# Patient Record
Sex: Male | Born: 2000 | Race: Black or African American | Hispanic: No | Marital: Single | Smoking: Never smoker
Health system: Southern US, Community
[De-identification: ages and names within clinical notes are randomized; demographics above are authoritative.]

---
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
## 2020-03-06 ENCOUNTER — Emergency Department: Payer: Medicaid Other

## 2020-03-06 ENCOUNTER — Other Ambulatory Visit: Payer: Self-pay

## 2020-03-06 ENCOUNTER — Emergency Department
Admission: EM | Admit: 2020-03-06 | Discharge: 2020-03-07 | Disposition: A | Discharge status: Home / Self Care | Payer: Medicaid Other | Attending: Student in an Organized Health Care Education/Training Program | Admitting: Student in an Organized Health Care Education/Training Program

## 2020-03-06 ENCOUNTER — Encounter: Payer: Self-pay | Admitting: Emergency Medicine

## 2020-03-06 DIAGNOSIS — T1490XA Injury, unspecified, initial encounter: Secondary | ICD-10-CM

## 2020-03-06 DIAGNOSIS — S24109A Unspecified injury at unspecified level of thoracic spinal cord, initial encounter: Secondary | ICD-10-CM | POA: Diagnosis present

## 2020-03-06 DIAGNOSIS — Y92410 Unspecified street and highway as the place of occurrence of the external cause: Secondary | ICD-10-CM | POA: Diagnosis not present

## 2020-03-06 DIAGNOSIS — S21211A Laceration without foreign body of right back wall of thorax without penetration into thoracic cavity, initial encounter: Secondary | ICD-10-CM | POA: Diagnosis not present

## 2020-03-06 DIAGNOSIS — Z23 Encounter for immunization: Secondary | ICD-10-CM | POA: Diagnosis not present

## 2020-03-06 DIAGNOSIS — R0602 Shortness of breath: Secondary | ICD-10-CM | POA: Diagnosis not present

## 2020-03-06 DIAGNOSIS — Z87828 Personal history of other (healed) physical injury and trauma: Secondary | ICD-10-CM | POA: Diagnosis not present

## 2020-03-06 DIAGNOSIS — W25XXXA Contact with sharp glass, initial encounter: Secondary | ICD-10-CM | POA: Insufficient documentation

## 2020-03-06 DIAGNOSIS — S21221A Laceration with foreign body of right back wall of thorax without penetration into thoracic cavity, initial encounter: Secondary | ICD-10-CM | POA: Diagnosis not present

## 2020-03-06 DIAGNOSIS — S299XXA Unspecified injury of thorax, initial encounter: Secondary | ICD-10-CM | POA: Diagnosis not present

## 2020-03-06 DIAGNOSIS — R918 Other nonspecific abnormal finding of lung field: Secondary | ICD-10-CM | POA: Diagnosis not present

## 2020-03-06 LAB — BASIC METABOLIC PANEL
Anion gap: 12 (ref 5–15)
BUN: 10 mg/dL (ref 6–20)
CO2: 23 mmol/L (ref 22–32)
Calcium: 9 mg/dL (ref 8.9–10.3)
Chloride: 107 mmol/L (ref 98–111)
Creatinine, Ser: 1.1 mg/dL (ref 0.61–1.24)
GFR, Estimated: >60 mL/min (ref >60)
Glucose, Bld: 109 mg/dL — ABNORMAL HIGH (ref 70–99)
Potassium: 4 mmol/L (ref 3.5–5.1)
Sodium: 142 mmol/L (ref 135–145)

## 2020-03-06 LAB — CBC
HCT: 45 % (ref 39.0–52.0)
Hemoglobin: 15.1 g/dL (ref 13.0–17.0)
MCH: 27.4 pg (ref 26.0–34.0)
MCHC: 33.6 g/dL (ref 30.0–36.0)
MCV: 81.5 fL (ref 80.0–100.0)
Platelets: 237 10*3/uL (ref 150–400)
RBC: 5.52 MIL/uL (ref 4.22–5.81)
RDW: 12.5 % (ref 11.5–15.5)
WBC: 7.1 10*3/uL (ref 4.0–10.5)
nRBC: 0 % (ref 0.0–0.2)

## 2020-03-06 MED ORDER — ONDANSETRON HCL 4 MG/2ML IJ SOLN
4.0000 mg | Freq: Once | INTRAMUSCULAR | Status: AC
  Administered 2020-03-06: 22:00:00 4 mg via INTRAVENOUS
  Filled 2020-03-06: qty 2

## 2020-03-06 MED ORDER — IOHEXOL 300 MG/ML  SOLN
75.0000 mL | Freq: Once | INTRAMUSCULAR | Status: AC | PRN
  Administered 2020-03-06: 22:00:00 75 mL via INTRAVENOUS

## 2020-03-06 MED ORDER — BACITRACIN-NEOMYCIN-POLYMYXIN 400-5-5000 EX OINT
TOPICAL_OINTMENT | CUTANEOUS | Status: AC
  Administered 2020-03-06: 1
  Filled 2020-03-06: qty 1

## 2020-03-06 MED ORDER — TETANUS-DIPHTH-ACELL PERTUSSIS 5-2.5-18.5 LF-MCG/0.5 IM SUSY
0.5000 mL | PREFILLED_SYRINGE | Freq: Once | INTRAMUSCULAR | Status: AC
  Administered 2020-03-06: 22:00:00 0.5 mL via INTRAMUSCULAR
  Filled 2020-03-06: qty 0.5

## 2020-03-06 MED ORDER — BACITRACIN ZINC 500 UNIT/GM EX OINT: TOPICAL_OINTMENT | Freq: Once | CUTANEOUS | Status: AC

## 2020-03-06 MED ORDER — MORPHINE SULFATE (PF) 4 MG/ML IV SOLN
4.0000 mg | INTRAVENOUS | Status: DC | PRN
Start: 2020-03-06 — End: 2020-03-07
  Administered 2020-03-06: 4 mg via INTRAVENOUS
  Filled 2020-03-06: qty 1

## 2020-03-06 MED ORDER — SODIUM CHLORIDE 0.9 % IV BOLUS
500.0000 mL | Freq: Once | INTRAVENOUS | Status: AC
  Administered 2020-03-06: 22:00:00 500 mL via INTRAVENOUS

## 2020-03-06 MED ORDER — LIDOCAINE-EPINEPHRINE 2 %-1:100000 IJ SOLN
20.0000 mL | Freq: Once | INTRAMUSCULAR | Status: AC
  Administered 2020-03-06: 22:00:00 20 mL via INTRADERMAL
  Filled 2020-03-06: qty 1
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
## 2020-03-23 ENCOUNTER — Encounter: Payer: Self-pay | Admitting: Emergency Medicine

## 2020-03-23 ENCOUNTER — Emergency Department
Admission: EM | Admit: 2020-03-23 | Discharge: 2020-03-23 | Disposition: A | Discharge status: Home / Self Care | Payer: Medicaid Other | Attending: Student in an Organized Health Care Education/Training Program | Admitting: Student in an Organized Health Care Education/Training Program

## 2020-03-23 ENCOUNTER — Other Ambulatory Visit: Payer: Self-pay

## 2020-03-23 DIAGNOSIS — Z4802 Encounter for removal of sutures: Secondary | ICD-10-CM | POA: Insufficient documentation

## 2020-03-23 DIAGNOSIS — S21211D Laceration without foreign body of right back wall of thorax without penetration into thoracic cavity, subsequent encounter: Secondary | ICD-10-CM | POA: Diagnosis not present

## 2020-03-23 NOTE — ED Triage Notes (Signed)
Patient was seen here on 03/06/20 and had stitches placed to back. Patient is here today to have stitches removed.

## 2020-03-23 NOTE — ED Provider Notes (Signed)
[facility] Emergency Department Provider Note  ____________________________________________  Time seen: Approximately 10:39 AM  I have reviewed the triage vital signs and the nursing notes.   HISTORY  Chief Complaint Suture / Staple Removal    HPI Marc Rogers is a 20 y.o. male that presents to the emergency department for suture removal.  Sutures were placed about 2 weeks ago.  No complications.   History reviewed. No pertinent past medical history.  There are no problems to display for this patient.   History reviewed. No pertinent surgical history.  Prior to Admission medications   Not on File    Allergies Patient has no known allergies.  No family history on file.  Social History Social History   Tobacco Use  . Smoking status: Never Smoker  . Smokeless tobacco: Never Used  Substance Use Topics  . Alcohol use: Never     Review of Systems  Cardiovascular: No chest pain. Respiratory: No SOB. Gastrointestinal: No abdominal pain.  No nausea, no vomiting.  Musculoskeletal: Negative for musculoskeletal pain. Skin: Negative for rash, abrasions, lacerations, ecchymosis. Neurological: Negative for headaches   ____________________________________________   PHYSICAL EXAM:  VITAL SIGNS: ED Triage Vitals [03/23/20 0955]  Enc Vitals Group     BP (!) 142/69     Pulse Rate 69     Resp 18     Temp 98.6 F (37 C)     Temp Source Oral     SpO2 97 %     Weight 199 lb 15.3 oz (90.7 kg)     Height 5\' 7"  (1.702 m)     Head Circumference      Peak Flow      Pain Score 0     Pain Loc      Pain Edu?      Excl. in GC?      Constitutional: Alert and oriented. Well appearing and in no acute distress. Eyes: Conjunctivae are normal. PERRL. EOMI. Head: Atraumatic. ENT:      Ears:      Nose: No congestion/rhinnorhea.      Mouth/Throat: Mucous membranes are moist.  Neck: No stridor.   Cardiovascular: Normal rate, regular rhythm.   Good peripheral circulation. Respiratory: Normal respiratory effort without tachypnea or retractions. Lungs CTAB. Good air entry to the bases with no decreased or absent breath sounds. Musculoskeletal: Full range of motion to all extremities. No gross deformities appreciated. Neurologic:  Normal speech and language. No gross focal neurologic deficits are appreciated.  Skin:  Skin is warm, dry and intact.  6 sutures in place to back.  No surrounding erythema or drainage. Psychiatric: Mood and affect are normal. Speech and behavior are normal. Patient exhibits appropriate insight and judgement.   ____________________________________________   LABS (all labs ordered are listed, but only abnormal results are displayed)  Labs Reviewed - No data to display ____________________________________________  EKG   ____________________________________________  RADIOLOGY  No results found.  ____________________________________________    PROCEDURES  Procedure(s) performed:    Procedures  SUTURE REMOVAL Performed by:  Consent: Verbal consent obtained. Patient identity confirmed: provided demographic data Time out: Immediately prior to procedure a "time out" was called to verify the correct patient, procedure, equipment, support staff and site/side marked as required.  Location details: back  Wound Appearance: clean  Sutures/Staples Removed: 6  Facility: sutures placed in this facility Patient tolerance: Patient tolerated the procedure well with no immediate complications.    Medications - No data to display  ____________________________________________   INITIAL IMPRESSION / ASSESSMENT AND PLAN / ED COURSE  Pertinent labs & imaging results that were available during my care of the patient were reviewed by me and considered in my medical decision making (see chart for details).  Review of the [place] CSRS was performed in accordance of the [facility] prior to  dispensing any controlled drugs.   Patient presented to the emergency department for suture removal.  Sutures were removed in the emergency department without complication. Patient is to follow up with PCP as directed. Patient is given ED precautions to return to the ED for any worsening or new symptoms.  Marc Rogers was evaluated in Emergency Department on 03/23/2020 for the symptoms described in the history of present illness. He was evaluated in the context of the global COVID-19 pandemic, which necessitated consideration that the patient might be at risk for infection with the SARS-CoV-2 virus that causes COVID-19. Institutional protocols and algorithms that pertain to the evaluation of patients at risk for COVID-19 are in a state of rapid change based on information released by regulatory bodies including the CDC and federal and state organizations. These policies and algorithms were followed during the patient's care in the ED.   ____________________________________________  FINAL CLINICAL IMPRESSION(S) / ED DIAGNOSES  Final diagnoses:  Visit for suture removal      NEW MEDICATIONS STARTED DURING THIS VISIT:  ED Discharge Orders    None          This chart was dictated using voice recognition software/Dragon. Despite best efforts to proofread, errors can occur which can change the meaning. Any change was purely unintentional.    Enid Derry, PA-C 03/23/20 1251    Willy Eddy, MD 03/23/20 1341
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
## 2021-07-08 ENCOUNTER — Other Ambulatory Visit: Payer: Self-pay

## 2021-07-08 ENCOUNTER — Encounter: Payer: Self-pay | Admitting: Emergency Medicine

## 2021-07-08 ENCOUNTER — Emergency Department
Admission: EM | Admit: 2021-07-08 | Discharge: 2021-07-08 | Disposition: A | Discharge status: Home / Self Care | Payer: Medicaid Other | Attending: Emergency Medicine | Admitting: Emergency Medicine

## 2021-07-08 ENCOUNTER — Emergency Department: Payer: Medicaid Other

## 2021-07-08 DIAGNOSIS — S161XXA Strain of muscle, fascia and tendon at neck level, initial encounter: Secondary | ICD-10-CM | POA: Diagnosis not present

## 2021-07-08 DIAGNOSIS — S60512A Abrasion of left hand, initial encounter: Secondary | ICD-10-CM | POA: Diagnosis not present

## 2021-07-08 DIAGNOSIS — M542 Cervicalgia: Secondary | ICD-10-CM

## 2021-07-08 DIAGNOSIS — M79645 Pain in left finger(s): Secondary | ICD-10-CM | POA: Diagnosis not present

## 2021-07-08 DIAGNOSIS — M79642 Pain in left hand: Secondary | ICD-10-CM | POA: Diagnosis not present

## 2021-07-08 DIAGNOSIS — S199XXA Unspecified injury of neck, initial encounter: Secondary | ICD-10-CM | POA: Diagnosis present

## 2021-07-08 MED ORDER — CYCLOBENZAPRINE HCL 10 MG PO TABS: 10.0000 mg | ORAL_TABLET | Freq: Three times a day (TID) | ORAL | 0 refills | Status: AC | PRN

## 2021-07-08 NOTE — Discharge Instructions (Addendum)
-  Please take the cyclobenzaprine as prescribed.  You may additionally take Tylenol/ibuprofen as needed for pain. ?-Follow-up with the orthopedist listed above.  Maintain the splint until you can be seen by orthopedics. ?-Return to the emergency department at any time if you begin to experience any new or worsening symptoms ?

## 2021-07-08 NOTE — ED Notes (Signed)
Dc instructions and scripts reviewed with pt no questions or concerns at this time. Will follow up with ortho  ?
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
## 2022-08-22 ENCOUNTER — Other Ambulatory Visit: Payer: Self-pay | Admitting: Certified Nurse Midwife

## 2022-08-22 MED ORDER — AZITHROMYCIN 500 MG PO TABS: 1000.0000 mg | ORAL_TABLET | Freq: Once | ORAL | 0 refills | Status: AC

## 2022-08-31 DIAGNOSIS — Z113 Encounter for screening for infections with a predominantly sexual mode of transmission: Secondary | ICD-10-CM | POA: Diagnosis not present
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
## 2024-03-29 ENCOUNTER — Ambulatory Visit: Payer: Self-pay

## 2024-03-29 DIAGNOSIS — Z113 Encounter for screening for infections with a predominantly sexual mode of transmission: Secondary | ICD-10-CM

## 2024-03-29 NOTE — Progress Notes (Signed)
 " [facility] Department STI clinic [address] [place] [address] Main phone: [phone]  STI screening visit  Subjective:  Benecio Kluger is a 24 y.o. male being seen today for an STI screening visit. The patient reports they do not have symptoms.    Patient has the following medical conditions:  There are no active problems to display for this patient.  Chief Complaint  Patient presents with   SEXUALLY TRANSMITTED DISEASE   HPI Patient reports desire for STI testing - GC/CT only. Declines blood tests today. No symptoms.   Reproductive considerations: Does the patient or their partner desires a pregnancy in the next year? No  See flowsheet for further details and programmatic requirements  Hyperlink available at the top of the signed note in blue.  Flow sheet content below:  Pregnancy Intention Screening Does the patient want to become pregnant in the next year?: N/A Does the patient's partner want to become pregnant in the next year?: Yes Would the patient like to discuss contraceptive options today?: N/A Reason For STD Screen STD Screening: Is asymptomatic Have you ever had an STD?: Yes History of Antibiotic use in the past 2 weeks?: No STD Symptoms Denies all: Yes Counseling Patient counseled to use condoms with all sex: Condoms declined RTC in 2-3 weeks for test results: Yes Clinic will call if test results abnormal before test result appt.: Yes Immunizations: Referred, Immunization history assessed Test results given to patient Patient counseled to use condoms with all sex: Condoms declined  Screening for MPX risk:  Unexplained rash?  No   MSM?  No   Multiple or anonymous sex partners?  No   Any close or sexual contact with a person  diagnosed with MPX?  No   Any outside the [place]  where MPX is endemic?  No   High clinical suspicion for MPX?    -Unlikely to be chickenpox    -Lymphadenopathy    -Rash that presents  in same phase of       evolution on any given body part  No   Does this patient meet CDC recommendations for vaccination against MPOX? No  You already have or anticipate having the following risks:  Your sex partner has the following risks: You're traveling to a county with a clade I MPOX outbreak and anticipate these risks: Occupational exposure  You had known or suspected exposure to someone with monkeypox You had a sex partner in the past 2 weeks who was diagnosed with monkeypox You are a gay, bisexual, or other man who has sex with men, or are transgender or nonbinary and in the past 6 months have had any of the following: - A new diagnosis of one or more sexually transmitted diseases (e.g., chlamydia, gonorrhea, or syphilis) - More than one sex partner You have had any of the following in the past 6 months: - Sex at a commercial sex venue (like a sex club or bathhouse) - Sex related to a large commercial event   or in a geographic area (city or county for example) where mpox virus transmission is occurring Sex with a new partner Sex at a commercial sex venue (e.g., a sex club or bathhouse) Sex in it consultant for money, goods, drugs, or other trade Sex in association with a large public event (e.g., a rave, party, or festival) i.e. certain people who work in a laboratory or healthcare facility   Infectious disease screenings: Vaccinated against HPV? Yes  HIV Ever  had a positive? No Last test: unknown Results in chart:  No results found for: HMHIVSCREEN No results found for: HIV   Hep B Hep B status: unknown or no prior testing Received HBV vaccination? Yes Received HBV testing for immunity? Unknown Results in chart:  No components found for: HMHEPBSCREEN  Do they qualify for HBV screening today? Declines blood tests  Criteria:  -Household, sexual or needle sharing contact with HBV -History of drug use or homelessness -HIV positive -Those with known Hep C  Hep C Hep C  status: unknown or no prior testing Results in chart:  No results found for: HMHEPCSCREEN No components found for: HEPC  Do they qualify for HCV screening today? Declines blood tests  Criteria - since the last HCV result, does the patient have any of the following? - Current drug use - Have a partner with drug use - Has been incarcerated  Immunization history:  Immunization History  Administered Date(s) Administered   Tdap 03/06/2020    The following portions of the patient's history were reviewed and updated as appropriate: allergies, current medications, past medical history, past social history, past surgical history and problem list.  Substance use screenings:  Uses tobacco products? No Uses vapes? No Uses alcohol? No Uses non-injectable substances that alter your mental status? Yes Uses non-prescribed injectable substances? No  Immunization History  Administered Date(s) Administered   Tdap 03/06/2020    The following portions of the patient's history were reviewed and updated as appropriate: allergies, current medications, past medical history, past social history, past surgical history and problem list.  Objective:  There were no vitals filed for this visit.  Physical Exam Vitals and nursing note reviewed.  Constitutional:      Appearance: Normal appearance.  HENT:     Head: Normocephalic and atraumatic.     Comments: No nits or hair loss of scalp, brows, and lashes    Mouth/Throat:     Mouth: Mucous membranes are moist.     Pharynx: Oropharynx is clear. No oropharyngeal exudate or posterior oropharyngeal erythema.  Eyes:     General:        Right eye: No discharge.        Left eye: No discharge.     Conjunctiva/sclera: Conjunctivae normal.     Right eye: Right conjunctiva is not injected. No exudate.    Left eye: Left conjunctiva is not injected. No exudate. Pulmonary:     Effort: Pulmonary effort is normal.  Genitourinary:    Comments: Politely declined  genital exam. Lymphadenopathy:     Cervical: No cervical adenopathy.     Upper Body:     Right upper body: No supraclavicular or axillary adenopathy.     Left upper body: No supraclavicular or axillary adenopathy.     Comments: Patient declines genital exam. Inguinal lymph nodes not assessed.   Skin:    General: Skin is warm and dry.     Findings: No lesion or rash.  Neurological:     Mental Status: He is alert and oriented to person, place, and time.    Assessment and Plan:  Saw Mendenhall is a 24 y.o. male presenting to the [facility] Department for STI screening.  Patient accepted the following screenings: urine CT/GC  1. Screening examination for sexually transmitted disease (Primary)  - Chlamydia/GC NAA, Confirmation   Counseling: Recommended condom use with all sex Discussed importance of condom use for STI prevention Discussed time line for [facility] results and that  patient will be called with positive results and encouraged patient to call if they had not heard in 2 weeks Recommended repeat testing in 3 months with positive results. Recommended returning for continued or worsening symptoms.   Return as needed for STI testing.  Damien FORBES Satchel, NP "

## 2024-03-29 NOTE — Progress Notes (Signed)
 Pt is here STD screening. Condoms declined. Austine Lefort, RN.

## 2024-04-02 LAB — CHLAMYDIA/GC NAA, CONFIRMATION
Chlamydia trachomatis, NAA: NEGATIVE
Neisseria gonorrhoeae, NAA: NEGATIVE
